# Patient Record
Sex: Female | Born: 1960 | Race: White | Hispanic: No | Marital: Single | Smoking: Never smoker
Health system: Southern US, Community
[De-identification: ages and names within clinical notes are randomized; demographics above are authoritative.]

## PROBLEM LIST (undated history)

## (undated) DIAGNOSIS — F32A Depression, unspecified: Secondary | ICD-10-CM

## (undated) DIAGNOSIS — E039 Hypothyroidism, unspecified: Secondary | ICD-10-CM

## (undated) DIAGNOSIS — H01009 Unspecified blepharitis unspecified eye, unspecified eyelid: Secondary | ICD-10-CM

## (undated) DIAGNOSIS — N200 Calculus of kidney: Secondary | ICD-10-CM

## (undated) DIAGNOSIS — M797 Fibromyalgia: Secondary | ICD-10-CM

## (undated) DIAGNOSIS — F329 Major depressive disorder, single episode, unspecified: Secondary | ICD-10-CM

## (undated) DIAGNOSIS — E785 Hyperlipidemia, unspecified: Secondary | ICD-10-CM

## (undated) HISTORY — PX: BREAST IMPLANT REMOVAL: SUR1101

## (undated) HISTORY — DX: Depression, unspecified: F32.A

## (undated) HISTORY — PX: FACIAL COSMETIC SURGERY: SHX629

## (undated) HISTORY — DX: Unspecified blepharitis unspecified eye, unspecified eyelid: H01.009

## (undated) HISTORY — DX: Hyperlipidemia, unspecified: E78.5

## (undated) HISTORY — PX: ABDOMINOPLASTY: SUR9

## (undated) HISTORY — PX: LITHOTRIPSY: SUR834

## (undated) HISTORY — DX: Fibromyalgia: M79.7

## (undated) HISTORY — PX: CHOLECYSTECTOMY: SHX55

## (undated) HISTORY — PX: KIDNEY STONE SURGERY: SHX686

## (undated) HISTORY — PX: ANKLE FRACTURE SURGERY: SHX122

## (undated) HISTORY — DX: Hypothyroidism, unspecified: E03.9

## (undated) HISTORY — DX: Calculus of kidney: N20.0

## (undated) HISTORY — PX: PLACEMENT OF BREAST IMPLANTS: SHX6334

---
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
## 2017-07-06 DIAGNOSIS — M797 Fibromyalgia: Secondary | ICD-10-CM | POA: Insufficient documentation

## 2017-07-06 DIAGNOSIS — N200 Calculus of kidney: Secondary | ICD-10-CM | POA: Insufficient documentation

## 2017-07-06 DIAGNOSIS — E039 Hypothyroidism, unspecified: Secondary | ICD-10-CM | POA: Insufficient documentation

## 2017-10-06 DIAGNOSIS — R748 Abnormal levels of other serum enzymes: Secondary | ICD-10-CM | POA: Insufficient documentation

## 2017-10-06 DIAGNOSIS — E782 Mixed hyperlipidemia: Secondary | ICD-10-CM | POA: Insufficient documentation

## 2019-08-12 ENCOUNTER — Encounter: Payer: Self-pay | Admitting: Neurology

## 2019-09-09 ENCOUNTER — Encounter: Payer: Self-pay | Admitting: Neurology

## 2019-09-09 NOTE — Progress Notes (Signed)
Haley AblesHolly Phillips was seen today in the movement disorders clinic for neurologic consultation at the request of Edythe LynnLauve, Lucie, MD.  The consultation is for the evaluation of tremor.  The records that were made available to me were reviewed.  Patient was first seen at [facility] on March 19, 2019 with complaints of tremor in the right hand and foot.  She was found to have mild parkinsonism.  She was given carbidopa/levodopa 25/100 and told work up to 1 tablet 3 times per day.  She returned to their office on July 25, 2019.  She had stopped levodopa, stating that she had side effects.  Her side effects consisted of nausea.  She states that she only took it for one day.  Patient was worried because her father had ALS.  Patient was reassured by Dr. Larose KellsLauve that her symptoms did not look consistent with ALS.  She is referred for a diagnosis for her symptoms.  Tremor: Yes.     How long has it been going on? Intermittently 2 years ago, in the R hand  At rest or with activation?  rest  Fam hx of tremor?  Yes.  (brother is an alcoholic and will tremor as result)  Located where?  R hand 2 years ago, then R hand in the last year and now can feel in the L thumb  Affected by caffeine:  No.  Affected by alcohol:  Doesn't drink enough to know  Affected by stress:  Yes.    Affected by fatigue:  No.  Other Specific Symptoms:  Voice: no known change Sleep: trouble getting and staying asleep  Vivid Dreams:  No. but does dream and remembers them  Acting out dreams:  No. Wet Pillows: some Postural symptoms:  Yes.    Falls?  Yes.  , fell 2 weeks ago.  Caught foot 2 weeks ago on ground and fell. Bradykinesia symptoms: slow movements and shorter stride length Loss of smell:  Yes.   Loss of taste:  No. Urinary Incontinence:  Yes.  , with urgency Difficulty Swallowing:  2 occasions - once with rice and once with pasta - had to stop and drink water Handwriting, micrographia: No.  But hand feels swollen Trouble  with ADL's:  No.  Trouble buttoning clothing: No. Depression:  Yes.  , but better than in the spring because is able to walk.  She tried pramipexole for depression but thinks it made her feel drugged but did help her tremor (.25 mg but unsure of how often took it - RX by psychiatry) Memory changes:  Yes.  , with word finding trouble Hallucinations:  No.  visual distortions: Yes.   N/V:  Yes.   with constipation Lightheaded:  Yes.    Syncope: No. Diplopia:  No. but has blurry vision from blepharitis Dyskinesia:  No.    Patient had an MRI of the brain on August 14, 2019.  This was reported to be normal.  I do not have the films, only the report.  PREVIOUS MEDICATIONS: carbidopa/levodopa (took one pill and had nausea); pramipexole with psychiatry for depression (helped tremor but SE - cannot remember what they were)  ALLERGIES:   Allergies  Allergen Reactions  . Doxycycline Rash  . Penicillins Rash  . Sulfa Antibiotics Rash    CURRENT MEDICATIONS:  Current Outpatient Medications  Medication Instructions  . DULoxetine (CYMBALTA) 60 MG capsule TAKE ONE CAPSULE BY MOUTH ONE TIME DAILY  . levothyroxine (SYNTHROID) 50 MCG tablet Oral    PAST  MEDICAL HISTORY:   Past Medical History:  Diagnosis Date  . Blepharitis   . Depression   . Fibromyalgia   . Hyperlipidemia   . Hypothyroidism   . Nephrolithiasis     PAST SURGICAL HISTORY:   Past Surgical History:  Procedure Laterality Date  . ABDOMINOPLASTY    . ANKLE FRACTURE SURGERY Right   . BREAST IMPLANT REMOVAL    . CHOLECYSTECTOMY    . FACIAL COSMETIC SURGERY    . KIDNEY STONE SURGERY    . LITHOTRIPSY    . PLACEMENT OF BREAST IMPLANTS      SOCIAL HISTORY:   Social History   Socioeconomic History  . Marital status: Single    Spouse name: Not on file  . Number of children: Not on file  . Years of education: Not on file  . Highest education level: Not on file  Occupational History  . Not on file  Tobacco Use  .  Smoking status: Never Smoker  . Smokeless tobacco: Never Used  Substance and Sexual Activity  . Alcohol use: Yes    Comment: 1 glass/month  . Drug use: Not on file    Comment: hx of cocaine/marijuana - sober x 30 years  . Sexual activity: Not on file  Other Topics Concern  . Not on file  Social History Narrative   Right handed      Some college      Lives alone. Has 2 cats and one dog   Social Determinants of Health   Financial Resource Strain:   . Difficulty of Paying Living Expenses: Not on file  Food Insecurity:   . Worried About Charity fundraiser in the Last Year: Not on file  . Ran Out of Food in the Last Year: Not on file  Transportation Needs:   . Lack of Transportation (Medical): Not on file  . Lack of Transportation (Non-Medical): Not on file  Physical Activity:   . Days of Exercise per Week: Not on file  . Minutes of Exercise per Session: Not on file  Stress:   . Feeling of Stress : Not on file  Social Connections:   . Frequency of Communication with Friends and Family: Not on file  . Frequency of Social Gatherings with Friends and Family: Not on file  . Attends Religious Services: Not on file  . Active Member of Clubs or Organizations: Not on file  . Attends Archivist Meetings: Not on file  . Marital Status: Not on file  Intimate Partner Violence:   . Fear of Current or Ex-Partner: Not on file  . Emotionally Abused: Not on file  . Physically Abused: Not on file  . Sexually Abused: Not on file    FAMILY HISTORY:   Family Status  Relation Name Status  . Father  Deceased  . Mother  Deceased  . Brother 2 Alive     PHYSICAL EXAMINATION:    VITALS:   Vitals:   09/23/19 0934  BP: (!) 153/87  Pulse: 83  SpO2: 96%  Weight: 208 lb (94.3 kg)  Height: 5\' 5"  (1.651 m)    GEN:  The patient appears stated age and is in NAD.  She appears anxious at times HEENT:  Normocephalic, atraumatic.  The mucous membranes are moist. The superficial  temporal arteries are without ropiness or tenderness. CV:  RRR Lungs:  CTAB Neck/HEME:  There are no carotid bruits bilaterally.  Neurological examination:  Orientation: The patient is alert and oriented x3. Fund  of knowledge is appropriate.  Recent and remote memory are intact.  Attention and concentration are normal.    Able to name objects and repeat phrases. Cranial nerves: There is good facial symmetry.  Extraocular muscles are intact. The visual fields are full to confrontational testing. The speech is fluent and clear. Soft palate rises symmetrically and there is no tongue deviation. Hearing is intact to conversational tone. Sensation: Sensation is intact to light.  Vibration is intact at the bilateral big toe. There is no extinction with double simultaneous stimulation. There is no sensory dermatomal level identified. Motor: Strength is 5/5 in the bilateral upper and lower extremities.   Shoulder shrug is equal and symmetric.  There is no pronator drift. Deep tendon reflexes: Deep tendon reflexes are 2/4 at the bilateral biceps, triceps, brachioradialis, patella and achilles. Plantar responses are downgoing bilaterally.  Movement examination: Tone: There is normal tone in the bilateral upper extremities.  The tone in the lower extremities is normal.  Abnormal movements: there is a rest tremor in the RUE Coordination:  There is  decremation with RAM's, only with finger taps on the right.  All other RAMs are normal, with any form of RAMS, including alternating supination and pronation of the forearm, hand opening and closing, heel taps and toe taps bilaterally Gait and Station: The patient has no difficulty arising out of a deep-seated chair without the use of the hands. The patient's stride length is good with just slight decreased on the right.  The patient has a neg pull test.     Labs: Patient had lab work on August 30, 2019 which I personally reviewed and interpreted.  White blood  cells were 5.8, hemoglobin 13.4, hematocrit 40.8 and platelets 280.  Sodium was 142, potassium 4.1, chloride 105, CO2 24, BUN 15, creatinine 0.88, glucose 103, AST 18, ALT 16, alkaline phosphatase 180.  TSH was 3.630.  ASSESSMENT/PLAN:  1.  Tremor  -pt doesn't fully meet all [place] brain bank criteria and we discussed that today  -pt somewhat frustrated.  States that she was previously told that it could be due to anxiety and asks me about that.  I did tell her that anxiety certainly can cause/exacerbate tremor.  -We will go ahead and do a DaTscan.  2.  Sweating  -pt describes unilateral sweating.  This would be unusual with PD.  I do think that cymbalta could be a cause of sweating, but also do not think that this would cause unilateral sweating.  3.  GAD  -could be exacerbating sx's  -sees psychiatry  -tried pramipexole and thought it helped tremor but had SE (she doesn't recall what)  -CBT could be of value  4.  F/u after above.      Cc:  Manickam, Dwain Sarna, MD

## 2019-09-23 ENCOUNTER — Encounter: Payer: Self-pay | Admitting: Neurology

## 2019-09-23 ENCOUNTER — Other Ambulatory Visit: Payer: Self-pay

## 2019-09-23 ENCOUNTER — Ambulatory Visit (INDEPENDENT_AMBULATORY_CARE_PROVIDER_SITE_OTHER): Payer: BC Managed Care – PPO | Admitting: Neurology

## 2019-09-23 VITALS — BP 153/87 | HR 83 | Ht 65.0 in | Wt 208.0 lb

## 2019-09-23 DIAGNOSIS — R251 Tremor, unspecified: Secondary | ICD-10-CM | POA: Diagnosis not present

## 2019-09-23 NOTE — Patient Instructions (Signed)
It was good to see you today!  We will get a DaT scan scheduled and make a follow up appointment after that.  The physicians and staff at [facility] are committed to providing excellent care. You may receive a survey requesting feedback about your experience at our office. We strive to receive "very good" responses to the survey questions. If you feel that your experience would prevent you from giving the office a "very good " response, please contact our office to try to remedy the situation. We may be reached at [phone]. Thank you for taking the time out of your busy day to complete the survey.

## 2019-09-30 ENCOUNTER — Telehealth: Payer: Self-pay

## 2019-09-30 NOTE — Telephone Encounter (Signed)
Faxed [employer] DaT scan consent form, OV notes, ICD 10 code and Dr. Don Perking info. Faxed to [phone]

## 2019-10-03 ENCOUNTER — Telehealth: Payer: Self-pay

## 2019-10-03 NOTE — Telephone Encounter (Signed)
   Just received an email from [employer]. The scan actually did not require a prior authorization. I have faxed that information over to [facility]. The will contact you with an appointment date and time for the DaT Scan.

## 2019-10-03 NOTE — Telephone Encounter (Signed)
Left message with [employer] for follow up on approval status for DaT scan. A representative was not available. Had to leave a message with out call back information. Pt made aware via mychart.

## 2019-10-17 ENCOUNTER — Encounter (HOSPITAL_COMMUNITY)
Admission: RE | Admit: 2019-10-17 | Discharge: 2019-10-17 | Disposition: A | Discharge status: Home / Self Care | Payer: BC Managed Care – PPO | Source: Ambulatory Visit | Attending: Neurology | Admitting: Neurology

## 2019-10-17 ENCOUNTER — Other Ambulatory Visit: Payer: Self-pay

## 2019-10-17 DIAGNOSIS — R251 Tremor, unspecified: Secondary | ICD-10-CM | POA: Insufficient documentation

## 2019-10-17 MED ORDER — IODINE STRONG (LUGOLS) 5 % PO SOLN: 0.8000 mL | Freq: Once | ORAL | Status: AC

## 2019-10-17 MED ORDER — IOFLUPANE I 123 185 MBQ/2.5ML IV SOLN
4.9400 | Freq: Once | INTRAVENOUS | Status: AC
  Administered 2019-10-17: 4.94 via INTRAVENOUS

## 2019-10-17 MED ORDER — IODINE STRONG (LUGOLS) 5 % PO SOLN
ORAL | Status: AC
  Administered 2019-10-17: 0.8 mL via ORAL
  Filled 2019-10-17: qty 1

## 2019-11-08 ENCOUNTER — Encounter: Payer: Self-pay | Admitting: Neurology

## 2019-11-08 NOTE — Progress Notes (Signed)
Haley Phillips was seen today in follow up for f/u DaT scan.  My previous records were reviewed prior to todays visit, as well as those of Dr. Larose Kells and dermatology.  Patient had a DaTscan since our last visit.  I personally reviewed that.  There is decreased uptake of the tracer on the left compared to that of the right.  She saw Dr. Larose Kells on February 3.  No medication changes were made with the exception of restarting her Cymbalta that she was off for the DaTscan (she was supposed to restart it after the DaTscan).  She saw dermatology on February 9 for the removal of a squamous cell carcinoma.    Notes that her R foot curves in on her and toes claw and cramp when at rest.  Tremor on the R hand and foot is worse.  Had 2 falls.  With one she was in storage closet and she tripped over the foot and her hand hit the shelves.  With one she fell outside with her dog.   Pt has occasional lightheadedness, but no near syncope.  No hallucinations.     PREVIOUS MEDICATIONS:carbidopa/levodopa 25/100 (one dose and had SE/nausea and d/c); pramipexole (worked for tremor but took for depression and felt had SE - nausea with 0.25 mg - only took 1 day - and d/c) ALLERGIES:   Allergies  Allergen Reactions  . Doxycycline Rash  . Penicillins Rash  . Sulfa Antibiotics Rash    CURRENT MEDICATIONS:  Outpatient Encounter Medications as of 11/11/2019  Medication Sig  . DULoxetine (CYMBALTA) 60 MG capsule TAKE ONE CAPSULE BY MOUTH ONE TIME DAILY  . levothyroxine (SYNTHROID) 50 MCG tablet Take by mouth.   No facility-administered encounter medications on file as of 11/11/2019.    PHYSICAL EXAMINATION:    VITALS:  There were no vitals filed for this visit.  GEN:  The patient appears stated age and is in NAD. HEENT:  Normocephalic, atraumatic.    Neurological examination:  Orientation: The patient is alert and oriented x3. Cranial nerves: There is good facial symmetry without facial hypomimia. The speech is  fluent and clear. Marland Kitchen Hearing is intact to conversational tone. Sensation: Sensation is intact to light touch throughout Motor: Strength is at least antigravity x4.  Movement examination: Abnormal movements: Can see occasional rest tremor in the right hand. Coordination:  There is slowness with hand opening and closing and finger taps on the right.    ASSESSMENT/PLAN:  1.  Abnormal DaTscan  -Likely does represent early Parkinson's disease.  Patient is very worried about an atypical state.  While she could have one, as these can present very similar to Parkinson's early on, I really do not see very many atypical features at this point in time.  She is very sensitive to medication, although I do not necessarily call these medication failures.  She took 1 dose of levodopa and felt nauseated and did not take it again.  Levodopa is something that she will likely need in the future and we discussed that today.  Likewise, she took one dose of pramipexole for depression (0.25 mg) and felt nauseated and discontinued.  I think it would be wise if we tried rotigotine.  It bypasses the GI tract, and generally is well-tolerated.  In addition, she could print out a coupon for this.  I have samples, although she does not wish to drive here to pick up the samples if she can avoid it.  She would rather check with  her primary neurologist, Dr. Ellin Goodie, to see if she has samples.  That is reasonable.  If not, I told her I would recommend she pick up the samples at my office since I would recommend she try the 2 mg samples first to make sure she tolerates it before going up to 4 mg samples.  I also recommended that she print out coupons from the [employer].  Risk, benefits, and side effects of dopamine agonist were discussed.  Patient expressed understanding and willingness to try the medication.  -Patient asked again about her father having ALS.  She is worried about having ALS.  When I saw her in the office last visit, I  did not see any evidence of motor neuron disease.  Likewise, Dr. Ellin Goodie has not noted that.  Usually, ALS is not genetic (genetic variant is rare).  This certainly does not mean that she will never develop ALS over her lifetime, but she does not have clinical features of that currently.    -Patient and I did discuss the importance of safe, cardiovascular exercise.  -We discussed the importance of multidisciplinary care and what that meant.  -Patient lives in [place].  She expressed that she really would like to try and avoid coming back here for care if she could.  She would like to follow-up with Dr. Ellin Goodie.    2.  F/u prn.  She can use our Parkinsons Disease social worker/community resources if she would like but there are also a plethora available in [place] that are available to her.  Total time spent on today's visit was 40 minutes, including both face-to-face time and nonface-to-face time.  Time included that spent on review of records (prior notes available to me/labs/imaging if pertinent), discussing treatment and goals, answering patient's questions and coordinating care.  Cc:  Manickam, Panneer S, MD/

## 2019-11-11 ENCOUNTER — Other Ambulatory Visit: Payer: Self-pay

## 2019-11-11 ENCOUNTER — Encounter: Payer: Self-pay | Admitting: Neurology

## 2019-11-11 ENCOUNTER — Telehealth (INDEPENDENT_AMBULATORY_CARE_PROVIDER_SITE_OTHER): Payer: BC Managed Care – PPO | Admitting: Neurology

## 2019-11-11 DIAGNOSIS — G20 Parkinson's disease: Secondary | ICD-10-CM

## 2019-11-11 MED ORDER — ROTIGOTINE 4 MG/24HR TD PT24: 1.0000 | MEDICATED_PATCH | Freq: Every day | TRANSDERMAL | 3 refills | Status: AC

## 2019-11-28 ENCOUNTER — Ambulatory Visit: Payer: BC Managed Care – PPO | Admitting: Neurology

## 2020-01-01 NOTE — Progress Notes (Signed)
Pt had Neupro patch sample kit up front never picked up returned to stock,

## 2020-05-25 IMAGING — NM NM DATSCAN
4 series · 33 of 40 positions shown · non-contrast
Comparison: None.

CLINICAL DATA: 58-year-old female. RIGHT hand tremors which
progressed to RIGHT foot 1 year prior.

EXAM:
NUCLEAR MEDICINE BRAIN IMAGING WITH SPECT  (DaTscan )
TECHNIQUE: SPECT images of the brain were obtained after intravenous injection
of radiopharmaceutical. 4 hour post injection imaging. Appropriate
positioning. 0.8 ml lugols solution administered in a.m
RADIOPHARMACEUTICALS:  4.9 millicuries I 123 Ioflupane

[Series 1: brain spect · 4.14mm/px · 5 of 120 frames shown]
[frame 11/120  full-range]
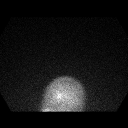
[frame 31/120  full-range]
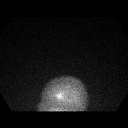
[frame 71/120  full-range]
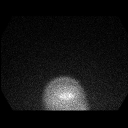
[frame 91/120  full-range]
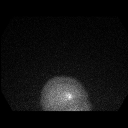
[frame 111/120  full-range]
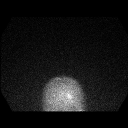

[Series 1: spect - (id) _(id)_cor · 4.1mm · 4.14mm/px · 5 of 128 frames shown]
[frame 11/128]
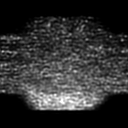
[frame 32/128]
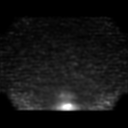
[frame 75/128]
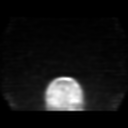
[frame 96/128]
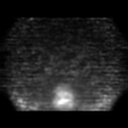
[frame 118/128]
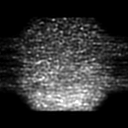

[Series 1: spect - (id) _(id) · 4.1mm · 4.14mm/px · 5 of 128 frames shown]
[frame 11/128]
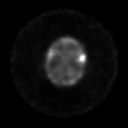
[frame 32/128]
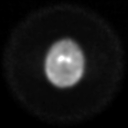
[frame 75/128]
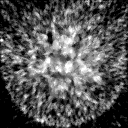
[frame 96/128]
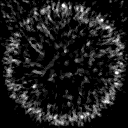
[frame 118/128]
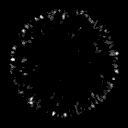

[Series 1016: mpr (id) range · 0.61mm/px · 18 of 35 slices shown]
[im 1/35]
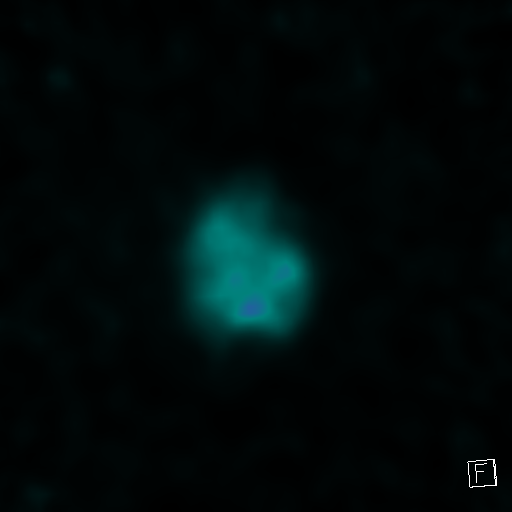
[im 4/35]
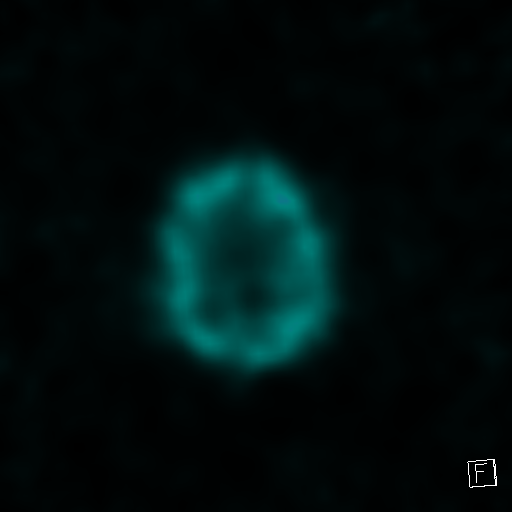
[im 5/35]
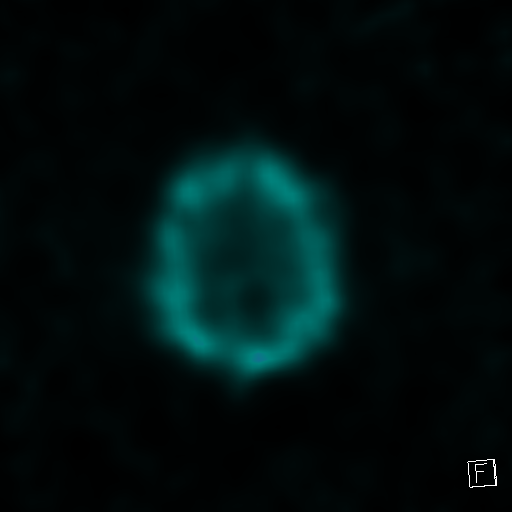
[im 7/35]
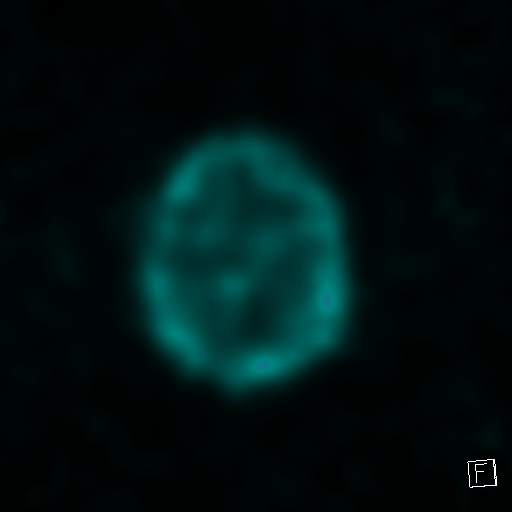
[im 9/35]
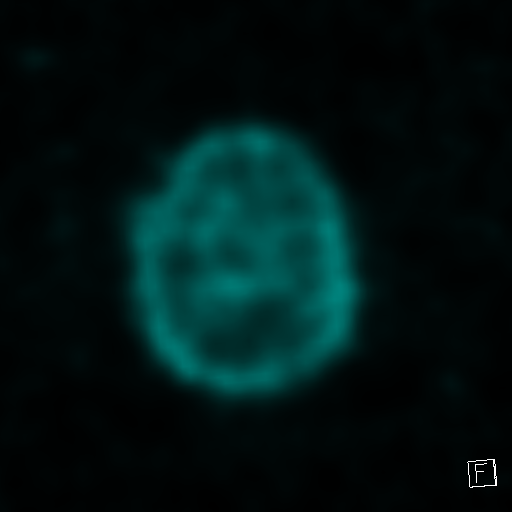
[im 10/35]
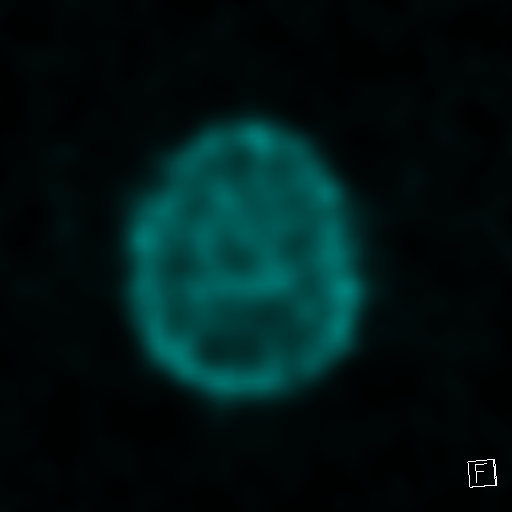
[im 13/35]
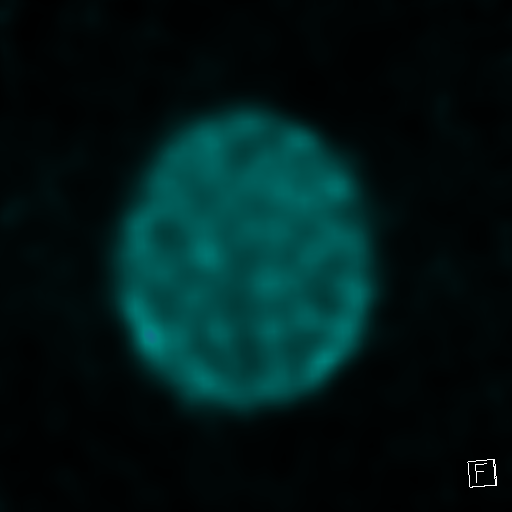
[im 15/35]
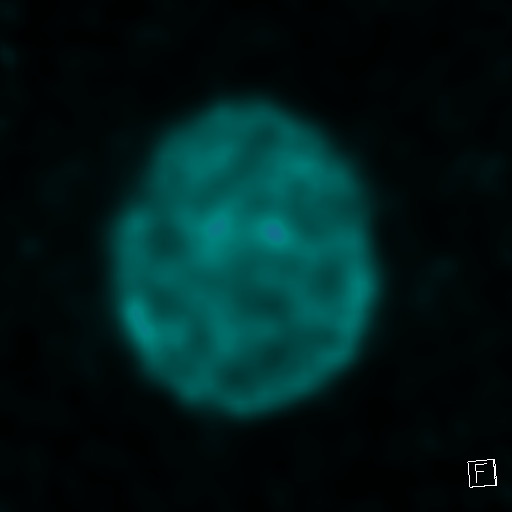
[im 17/35]
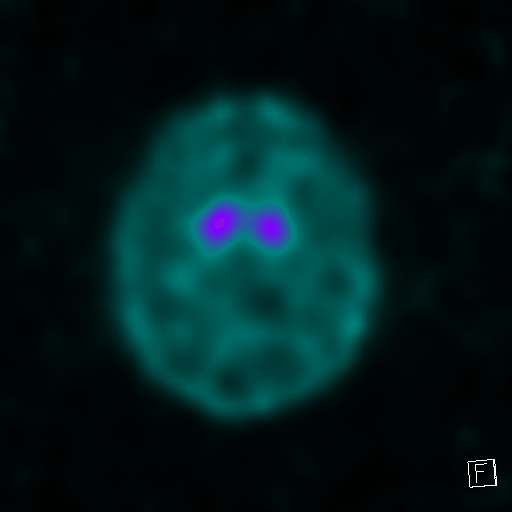
[im 18/35]
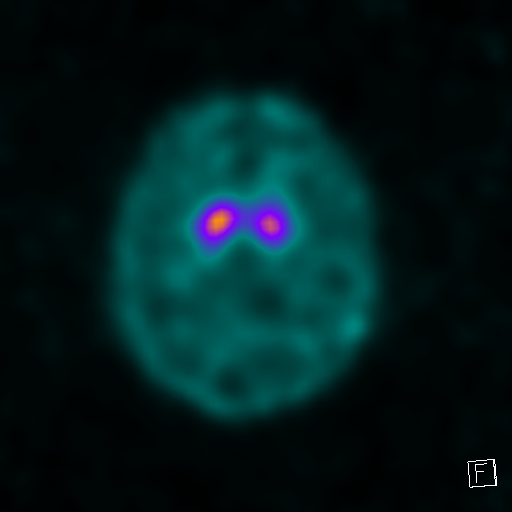
[im 20/35]
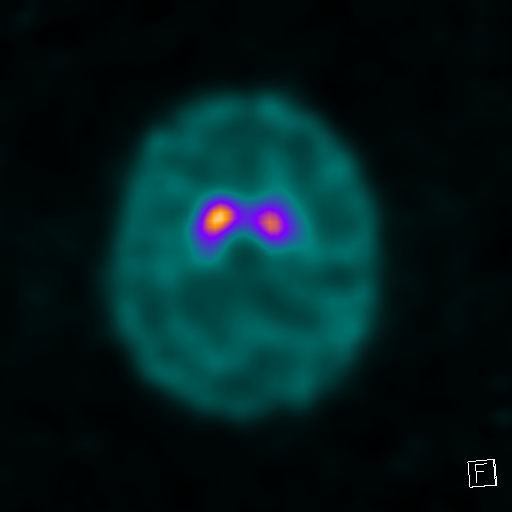
[im 23/35]
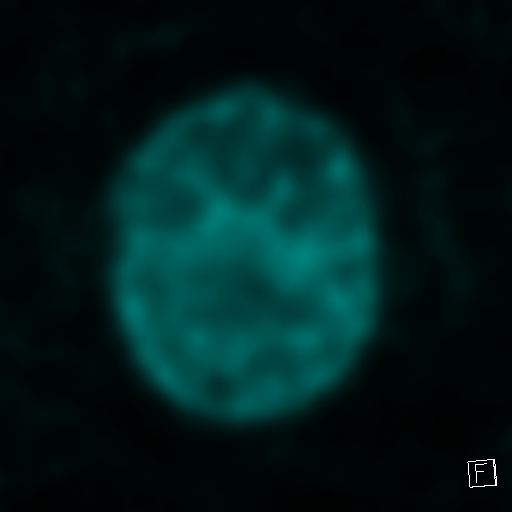
[im 25/35]
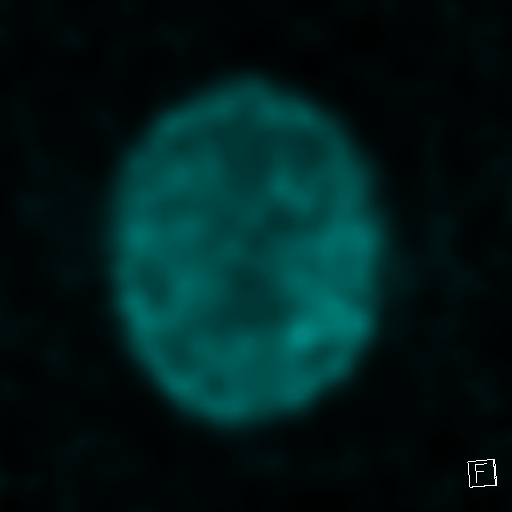
[im 26/35]
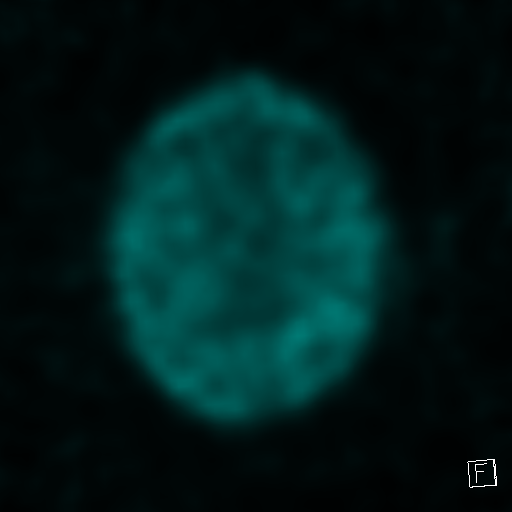
[im 28/35]
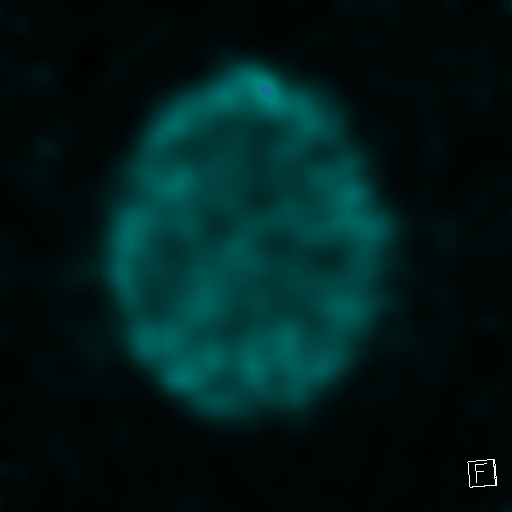
[im 30/35]
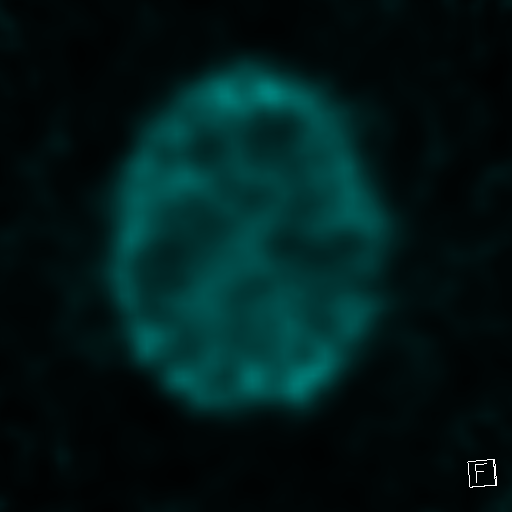
[im 33/35]
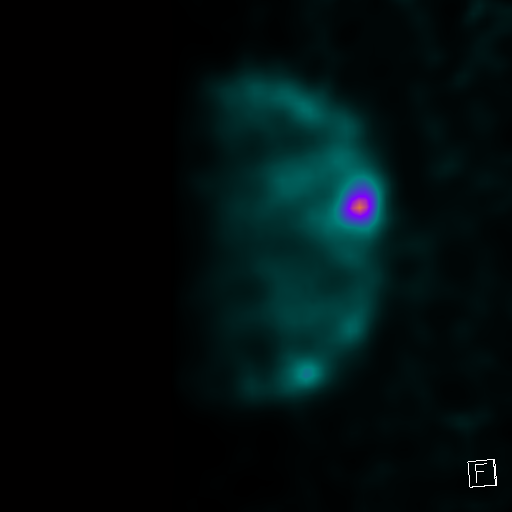
[im 35/35]
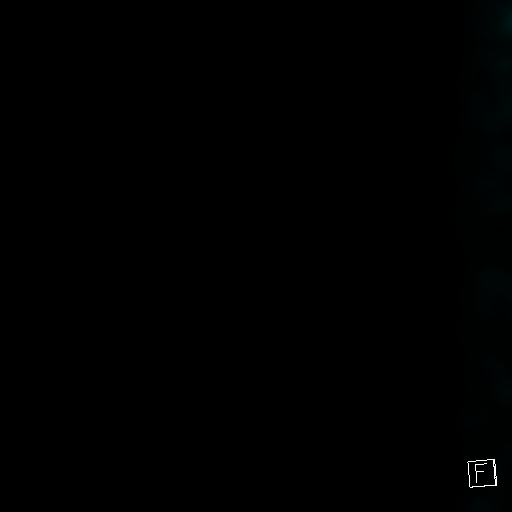

[33 of 40 positions shown; findings below may reference images not displayed]

FINDINGS: Significant decreased radiotracer activity in the posterior LEFT
striata (putamen). Mild decreased relative activity in the head of
the LEFT caudate nucleus compared to the RIGHT. Relatively preserved
normal shape and activity of the RIGHT striatum.
IMPRESSION: Asymmetric decreased radiotracer activity in the LEFT striatum is a
pattern typical of Parkinson's syndrome pathology.
